# Patient Record
Sex: Male | Born: 1995 | Race: Black or African American | Hispanic: No | Marital: Single | Smoking: Never smoker
Health system: Southern US, Community
[De-identification: ages and names within clinical notes are randomized; demographics above are authoritative.]

---

## 2018-11-12 ENCOUNTER — Emergency Department (HOSPITAL_COMMUNITY)
Admission: EM | Admit: 2018-11-12 | Discharge: 2018-11-13 | Disposition: A | Discharge status: Home / Self Care | Payer: Managed Care, Other (non HMO) | Attending: Emergency Medicine | Admitting: Emergency Medicine

## 2018-11-12 DIAGNOSIS — E876 Hypokalemia: Secondary | ICD-10-CM | POA: Diagnosis not present

## 2018-11-12 DIAGNOSIS — R5381 Other malaise: Secondary | ICD-10-CM | POA: Diagnosis not present

## 2018-11-12 DIAGNOSIS — Z20822 Contact with and (suspected) exposure to covid-19: Secondary | ICD-10-CM

## 2018-11-12 DIAGNOSIS — R51 Headache: Secondary | ICD-10-CM | POA: Diagnosis present

## 2018-11-12 DIAGNOSIS — G44209 Tension-type headache, unspecified, not intractable: Secondary | ICD-10-CM

## 2018-11-12 DIAGNOSIS — R6889 Other general symptoms and signs: Secondary | ICD-10-CM

## 2018-11-13 ENCOUNTER — Other Ambulatory Visit: Payer: Self-pay

## 2018-11-13 ENCOUNTER — Emergency Department (HOSPITAL_COMMUNITY): Payer: Managed Care, Other (non HMO)

## 2018-11-13 LAB — CBC WITH DIFFERENTIAL/PLATELET
Abs Immature Granulocytes: 0.02 10*3/uL (ref 0.00–0.07)
Basophils Absolute: 0 10*3/uL (ref 0.0–0.1)
Basophils Relative: 0 %
Eosinophils Absolute: 0.1 10*3/uL (ref 0.0–0.5)
Eosinophils Relative: 1 %
HCT: 38.8 % — ABNORMAL LOW (ref 39.0–52.0)
Hemoglobin: 13.2 g/dL (ref 13.0–17.0)
Immature Granulocytes: 0 %
Lymphocytes Relative: 42 %
Lymphs Abs: 3.1 10*3/uL (ref 0.7–4.0)
MCH: 30.6 pg (ref 26.0–34.0)
MCHC: 34 g/dL (ref 30.0–36.0)
MCV: 90 fL (ref 80.0–100.0)
Monocytes Absolute: 0.6 10*3/uL (ref 0.1–1.0)
Monocytes Relative: 8 %
Neutro Abs: 3.6 10*3/uL (ref 1.7–7.7)
Neutrophils Relative %: 49 %
Platelets: 241 10*3/uL (ref 150–400)
RBC: 4.31 MIL/uL (ref 4.22–5.81)
RDW: 11.8 % (ref 11.5–15.5)
WBC: 7.4 10*3/uL (ref 4.0–10.5)
nRBC: 0 % (ref 0.0–0.2)

## 2018-11-13 LAB — COMPREHENSIVE METABOLIC PANEL
ALT: 16 U/L (ref 0–44)
AST: 20 U/L (ref 15–41)
Albumin: 4.4 g/dL (ref 3.5–5.0)
Alkaline Phosphatase: 50 U/L (ref 38–126)
Anion gap: 7 (ref 5–15)
BUN: 6 mg/dL (ref 6–20)
CO2: 26 mmol/L (ref 22–32)
Calcium: 9.2 mg/dL (ref 8.9–10.3)
Chloride: 104 mmol/L (ref 98–111)
Creatinine, Ser: 1.02 mg/dL (ref 0.61–1.24)
GFR calc Af Amer: >60 mL/min (ref >60)
GFR calc non Af Amer: >60 mL/min (ref >60)
Glucose, Bld: 176 mg/dL — ABNORMAL HIGH (ref 70–99)
Potassium: 3.1 mmol/L — ABNORMAL LOW (ref 3.5–5.1)
Sodium: 137 mmol/L (ref 135–145)
Total Bilirubin: 1 mg/dL (ref 0.3–1.2)
Total Protein: 6.7 g/dL (ref 6.5–8.1)

## 2018-11-13 LAB — URINALYSIS, ROUTINE W REFLEX MICROSCOPIC
Bilirubin Urine: NEGATIVE
Glucose, UA: NEGATIVE mg/dL
Hgb urine dipstick: NEGATIVE
Ketones, ur: NEGATIVE mg/dL
Leukocytes,Ua: NEGATIVE
Nitrite: NEGATIVE
Protein, ur: NEGATIVE mg/dL
Specific Gravity, Urine: 1.026 (ref 1.005–1.030)
pH: 6 (ref 5.0–8.0)

## 2018-11-13 LAB — INFLUENZA PANEL BY PCR (TYPE A & B)
Influenza A By PCR: NEGATIVE
Influenza B By PCR: NEGATIVE

## 2018-11-13 LAB — LACTIC ACID, PLASMA
Lactic Acid, Venous: 2.5 mmol/L (ref 0.5–1.9)
Lactic Acid, Venous: 1.1 mmol/L (ref 0.5–1.9)

## 2018-11-13 LAB — RAPID URINE DRUG SCREEN, HOSP PERFORMED
Amphetamines: NOT DETECTED
Barbiturates: NOT DETECTED
Benzodiazepines: NOT DETECTED
Cocaine: NOT DETECTED
Opiates: NOT DETECTED
Tetrahydrocannabinol: POSITIVE — AB

## 2018-11-13 MED ORDER — ACETAMINOPHEN 325 MG PO TABS
650.0000 mg | ORAL_TABLET | Freq: Once | ORAL | Status: AC
  Administered 2018-11-13: 650 mg via ORAL
  Filled 2018-11-13: qty 2

## 2018-11-13 MED ORDER — SODIUM CHLORIDE 0.9 % IV BOLUS
1000.0000 mL | Freq: Once | INTRAVENOUS | Status: AC
  Administered 2018-11-13: 01:00:00 1000 mL via INTRAVENOUS

## 2018-11-13 MED ORDER — POTASSIUM CHLORIDE CRYS ER 20 MEQ PO TBCR
40.0000 meq | EXTENDED_RELEASE_TABLET | Freq: Once | ORAL | Status: AC
  Administered 2018-11-13: 40 meq via ORAL
  Filled 2018-11-13: qty 2

## 2018-11-13 NOTE — ED Provider Notes (Signed)
[facility]   CSN: [id number] Arrival date & time: 11/12/18  2353    History   Chief Complaint Chief Complaint  Patient presents with  . Headache    HPI Anthony Murphy is a 23 y.o. male with a hx of no major medical problems presents to the Emergency Department complaining of gradual, persistent, progressively worsening posterior headache and generalized abd discomfort with associated fatigue onset this afternoon.  Pt reports describes the headache like a band across the back.  He denies vision changes, weakness, Pt reports he began "shaking" on his way over to the ED.  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  He denies known sick contacts, vision changes, neck pain, neck stiffness, chest pain, cough, SOB, N/V/D, syncope, dysuria.  Pt reports he smokes several black and mild cigars per day, drinks occasionally and smoked marijuana today.  Pt reports no EtOH today.  No aggravating or alleviating factors.  No treatments PTA.       The history is provided by the patient and medical records. No language interpreter was used.    No past medical history on file.  There are no active problems to display for this patient.    Home Medications    Prior to Admission medications   Not on File    Family History No family history on file.  Social History Social History   Tobacco Use  . Smoking status: Not on file  Substance Use Topics  . Alcohol use: Not on file  . Drug use: Not on file     Allergies   Patient has no known allergies.   Review of Systems Review of Systems  Constitutional: Positive for fatigue. Negative for appetite change, diaphoresis, fever and unexpected weight change.  HENT: Negative for mouth sores.   Eyes: Negative for visual disturbance.  Respiratory: Negative for cough, chest tightness, shortness of breath and wheezing.   Cardiovascular: Negative for chest pain.  Gastrointestinal:  Positive for abdominal pain. Negative for constipation, diarrhea, nausea and vomiting.  Endocrine: Negative for polydipsia, polyphagia and polyuria.  Genitourinary: Negative for dysuria, frequency, hematuria and urgency.  Musculoskeletal: Negative for back pain and neck stiffness.  Skin: Negative for rash.  Allergic/Immunologic: Negative for immunocompromised state.  Neurological: Positive for headaches. Negative for syncope and light-headedness.  Hematological: Does not bruise/bleed easily.  Psychiatric/Behavioral: Negative for sleep disturbance. The patient is not nervous/anxious.      Physical Exam Updated Vital Signs BP (!) 157/97   Pulse (!) 136   Temp 98.1 F (36.7 C) (Oral)   Resp 18   SpO2 100%   Physical Exam Vitals signs and nursing note reviewed.  Constitutional:      General: He is not in acute distress.    Appearance: He is not diaphoretic.     Comments: Rigors  HENT:     Head: Normocephalic.  Eyes:     General: No scleral icterus.    Conjunctiva/sclera: Conjunctivae normal.  Neck:     Musculoskeletal: Normal range of motion. Normal range of motion. No spinous process tenderness or muscular tenderness.  Cardiovascular:     Rate and Rhythm: Regular rhythm. Tachycardia present.     Pulses: Normal pulses.          Radial pulses are 2+ on the right side and 2+ on the left side.  Pulmonary:     Effort: No tachypnea, accessory muscle usage, prolonged expiration, respiratory distress or retractions.  Breath sounds: No stridor.     Comments: Equal chest rise. No increased work of breathing. Abdominal:     General: There is no distension.     Palpations: Abdomen is soft.     Tenderness: There is no abdominal tenderness. There is no guarding or rebound.  Musculoskeletal:     Comments: Moves all extremities equally and without difficulty.  Skin:    General: Skin is warm and dry.     Capillary Refill: Capillary refill takes less than 2 seconds.  Neurological:      Mental Status: He is alert.     GCS: GCS eye subscore is 4. GCS verbal subscore is 5. GCS motor subscore is 6.     Comments: Speech is clear and goal oriented.  Psychiatric:        Mood and Affect: Mood normal.      ED Treatments / Results  Labs (all labs ordered are listed, but only abnormal results are displayed) Labs Reviewed  LACTIC ACID, PLASMA - Abnormal; Notable for the following components:      Result Value   Lactic Acid, Venous 2.5 (*)    All other components within normal limits  COMPREHENSIVE METABOLIC PANEL - Abnormal; Notable for the following components:   Potassium 3.1 (*)    Glucose, Bld 176 (*)    All other components within normal limits  CBC WITH DIFFERENTIAL/PLATELET - Abnormal; Notable for the following components:   HCT 38.8 (*)    All other components within normal limits  RAPID URINE DRUG SCREEN, HOSP PERFORMED - Abnormal; Notable for the following components:   Tetrahydrocannabinol POSITIVE (*)    All other components within normal limits  CULTURE, BLOOD (ROUTINE X 2)  CULTURE, BLOOD (ROUTINE X 2)  LACTIC ACID, PLASMA  URINALYSIS, ROUTINE W REFLEX MICROSCOPIC  INFLUENZA PANEL BY PCR (TYPE A & B)    EKG EKG Interpretation  Date/Time:  Friday November 13 2018 00:20:05 EDT Ventricular Rate:  126 PR Interval:    QRS Duration: 88 QT Interval:  297 QTC Calculation: 430 R Axis:   87 Text Interpretation:  Sinus tachycardia Consider right atrial enlargement Borderline T wave abnormalities NO STEMI. No old tracing to compare Confirmed by Drema Pry [phone]) on 11/13/2018 2:03:33 AM   Radiology Dg Chest Port 1 View  Result Date: 11/13/2018 CLINICAL DATA:  Tachycardia, shaking and headache since last night. EXAM: PORTABLE CHEST 1 VIEW COMPARISON:  None. FINDINGS: Cardiomediastinal silhouette is normal. No pleural effusions or focal consolidations. Trachea projects midline and there is no pneumothorax. Soft tissue planes and included osseous structures  are non-suspicious. IMPRESSION: Negative. Electronically Signed   By: Awilda Metro M.D.   On: 11/13/2018 00:29    Procedures Procedures (including critical care time)  Medications Ordered in ED Medications  sodium chloride 0.9 % bolus 1,000 mL (0 mLs Intravenous Stopped 11/13/18 0128)  acetaminophen (TYLENOL) tablet 650 mg (650 mg Oral Given 11/13/18 0205)  potassium chloride SA (K-DUR,KLOR-CON) CR tablet 40 mEq (40 mEq Oral Given 11/13/18 0259)     Initial Impression / Assessment and Plan / ED Course  I have reviewed the triage vital signs and the nursing notes.  Pertinent labs & imaging results that were available during my care of the patient were reviewed by me and considered in my medical decision making (see chart for details).  Clinical Course as of Nov 13 518  Fri Nov 13, 2018  0010 Pt tachycardic  Pulse Rate(!): 136 [HM]    Clinical  Course User Index [HM] Dencil Cayson, Dahlia Client, PA-C        Gemari Kendal was evaluated in Emergency Department on 11/13/2018 for the symptoms described in the history of present illness. He was evaluated in the context of the global COVID-19 pandemic, which necessitated consideration that the patient might be at risk for infection with the SARS-CoV-2 virus that causes COVID-19. Institutional protocols and algorithms that pertain to the evaluation of patients at risk for COVID-19 are in a state of rapid change based on information released by regulatory bodies including the CDC and federal and state organizations. These policies and algorithms were followed during the patient's care in the ED.  Pt presents with headache and abd pain.  He is tachycardic with rigors.  Suspect fever. Concern for possible COVID.  Pt will have labs, fluid, CXR, ECG.    Initial labs reassuring however lactic acid is elevated at 2.5.  Mild hypokalemia with potassium 3.1, replaced here in the emergency department.  Drug screen positive for marijuana.  No evidence of urinary  tract infection.  Influenza panel negative.  Patient given fluids and Tylenol.  His heart rate has decreased to within normal limits and has remained there.  Patient is without hypotension.  No hypoxia.  Patient with complete resolution of his headache and abdominal discomfort.  He has tolerated p.o. here in the emergency department without return of abdominal discomfort, nausea or vomiting.  Repeat lactic acid is within normal limits.  Patient reports that he feels well and does wish to go home.  I have discussed the importance of symptom monitoring and fever control at home in addition to maintaining adequate hydration.  I have also discussed reasons to return immediately to the emergency department.  Patient states understanding and is in agreement with this plan.  The patient was discussed with Dr. Eudelia Bunch who agrees with the treatment plan.   BP (!) 109/57   Pulse 68   Temp 99.2 F (37.3 C) (Rectal)   Resp 14   SpO2 98%    Final Clinical Impressions(s) / ED Diagnoses   Final diagnoses:  Acute non intractable tension-type headache  Malaise  Suspected Covid-19 Virus Infection  Hypokalemia    ED Discharge Orders    None       Mardene Sayer Boyd Kerbs 11/13/18 0520    Nira Conn, MD 11/13/18 [phone]

## 2018-11-13 NOTE — ED Triage Notes (Signed)
Pt reports headache starting today. Pt reports he just doesn't feel well, feels shaky. Denies nausea. Pt denies sick contact.

## 2018-11-13 NOTE — Discharge Instructions (Signed)
1. Medications: usual home medications 2. Treatment: rest, drink plenty of fluids,  3. Follow Up: Please followup with your primary doctor in 2 days for discussion of your diagnoses and further evaluation after today's visit; if you do not have a primary care doctor use the resource guide provided to find one; Please return to the ER for worsening fevers, persistent vomiting, difficulty breathing or other concerns

## 2018-11-18 LAB — CULTURE, BLOOD (ROUTINE X 2)
Culture: NO GROWTH
Culture: NO GROWTH
Special Requests: ADEQUATE

## 2018-11-27 ENCOUNTER — Telehealth (HOSPITAL_COMMUNITY): Payer: Self-pay

## 2019-08-03 ENCOUNTER — Other Ambulatory Visit: Payer: Managed Care, Other (non HMO)

## 2019-09-30 ENCOUNTER — Other Ambulatory Visit: Payer: Self-pay

## 2019-09-30 ENCOUNTER — Encounter (HOSPITAL_COMMUNITY): Payer: Self-pay

## 2019-09-30 ENCOUNTER — Ambulatory Visit (HOSPITAL_COMMUNITY)
Admission: EM | Admit: 2019-09-30 | Discharge: 2019-09-30 | Disposition: A | Discharge status: Home / Self Care | Payer: Managed Care, Other (non HMO) | Attending: Family Medicine | Admitting: Family Medicine

## 2019-09-30 DIAGNOSIS — H9201 Otalgia, right ear: Secondary | ICD-10-CM

## 2019-09-30 MED ORDER — AMOXICILLIN 500 MG PO CAPS: 1000.0000 mg | ORAL_CAPSULE | Freq: Three times a day (TID) | ORAL | 0 refills | Status: AC

## 2019-09-30 MED ORDER — CARBAMIDE PEROXIDE 6.5 % OT SOLN: 5.0000 [drp] | Freq: Two times a day (BID) | OTIC | 0 refills | Status: AC

## 2019-09-30 NOTE — Discharge Instructions (Addendum)
Since some Debrox and antibiotics to the pharmacy to go ahead and cover for ear infection in the right ear. He can take ibuprofen for pain as needed Follow up as needed for continued or worsening symptoms

## 2019-09-30 NOTE — ED Provider Notes (Signed)
[facility]    CSN: [id number] Arrival date & time: 09/30/19  1343      History   Chief Complaint Chief Complaint  Patient presents with  . Otalgia    HPI Anthony Murphy is a 24 y.o. male.   Pt is a 24 year old male that presents with right ear discomfort. This has been since yesterday. Took tylenol and used peroxide to clean with no relief. This pain has been coming and going. No fever, nasal congestion, rhinorrhea, dizziness. Some muffled hearing. No injury or foreign body.   ROS per HPI      History reviewed. No pertinent past medical history.  There are no problems to display for this patient.   History reviewed. No pertinent surgical history.     Home Medications    Prior to Admission medications   Medication Sig Start Date End Date Taking? Authorizing Provider  amoxicillin (AMOXIL) 500 MG capsule Take 2 capsules (1,000 mg total) by mouth 3 (three) times daily for 5 days. 09/30/19 10/05/19  Dahlia Byes A, NP  carbamide peroxide (DEBROX) 6.5 % OTIC solution Place 5 drops into the right ear 2 (two) times daily. 09/30/19   Janace Aris, NP    Family History Family History  Problem Relation Age of Onset  . Healthy Mother   . Healthy Father     Social History Social History   Tobacco Use  . Smoking status: Never Smoker  . Smokeless tobacco: Never Used  Substance Use Topics  . Alcohol use: Not Currently  . Drug use: Not on file     Allergies   Patient has no known allergies.   Review of Systems Review of Systems   Physical Exam Triage Vital Signs ED Triage Vitals  Enc Vitals Group     BP 09/30/19 1412 122/73     Pulse Rate 09/30/19 1412 78     Resp 09/30/19 1412 16     Temp 09/30/19 1412 98.5 F (36.9 C)     Temp Source 09/30/19 1412 Oral     SpO2 09/30/19 1412 99 %     Weight --      Height --      Head Circumference --      Peak Flow --      Pain Score 09/30/19 1411 0     Pain Loc --      Pain Edu? --      Excl. in GC?  --    No data found.  Updated Vital Signs BP 122/73 (BP Location: Left Arm)   Pulse 78   Temp 98.5 F (36.9 C) (Oral)   Resp 16   SpO2 99%   Visual Acuity Right Eye Distance:   Left Eye Distance:   Bilateral Distance:    Right Eye Near:   Left Eye Near:    Bilateral Near:     Physical Exam Vitals and nursing note reviewed.  Constitutional:      Appearance: Normal appearance.  HENT:     Head: Normocephalic and atraumatic.     Right Ear: External ear normal. There is impacted cerumen.     Left Ear: Tympanic membrane, ear canal and external ear normal.     Nose: Nose normal.     Mouth/Throat:     Pharynx: Oropharynx is clear. No posterior oropharyngeal erythema.  Eyes:     Conjunctiva/sclera: Conjunctivae normal.  Pulmonary:     Effort: Pulmonary effort is normal.  Musculoskeletal:  General: Normal range of motion.     Cervical back: Normal range of motion.  Skin:    General: Skin is warm and dry.  Neurological:     Mental Status: He is alert.  Psychiatric:        Mood and Affect: Mood normal.      UC Treatments / Results  Labs (all labs ordered are listed, but only abnormal results are displayed) Labs Reviewed - No data to display  EKG   Radiology No results found.  Procedures Procedures (including critical care time)  Medications Ordered in UC Medications - No data to display  Initial Impression / Assessment and Plan / UC Course  I have reviewed the triage vital signs and the nursing notes.  Pertinent labs & imaging results that were available during my care of the patient were reviewed by me and considered in my medical decision making (see chart for details).     Cerumen impaction with right otitis media Ear irrigated here in clinic.  Pt tolerated well  Erythematous post irrigation.  Pt still having pain.  Will go ahead and cover for ear infection Ibuprofen for pain  Recommended debrox in the future.  Follow up as needed for  continued or worsening symptoms  Final Clinical Impressions(s) / UC Diagnoses   Final diagnoses:  Otalgia of right ear     Discharge Instructions     Since some Debrox and antibiotics to the pharmacy to go ahead and cover for ear infection in the right ear. He can take ibuprofen for pain as needed Follow up as needed for continued or worsening symptoms     ED Prescriptions    Medication Sig Dispense Auth. Provider   amoxicillin (AMOXIL) 500 MG capsule Take 2 capsules (1,000 mg total) by mouth 3 (three) times daily for 5 days. 30 capsule Hosam Mcfetridge A, NP   carbamide peroxide (DEBROX) 6.5 % OTIC solution Place 5 drops into the right ear 2 (two) times daily. 15 mL Laureen Frederic A, NP     PDMP not reviewed this encounter.   Loura Halt A, NP 10/02/19 1014

## 2019-09-30 NOTE — ED Triage Notes (Signed)
Patient presents to Urgent Care with complaints of right ear pain since yesterday. Patient reports he has been taking tylenol but it is not helping.

## 2021-02-23 IMAGING — DX PORTABLE CHEST - 1 VIEW
1 series · 1 of 1 positions shown · non-contrast
Comparison: None.

CLINICAL DATA: Tachycardia, shaking and headache since last night.

EXAM:
PORTABLE CHEST 1 VIEW

[chest]
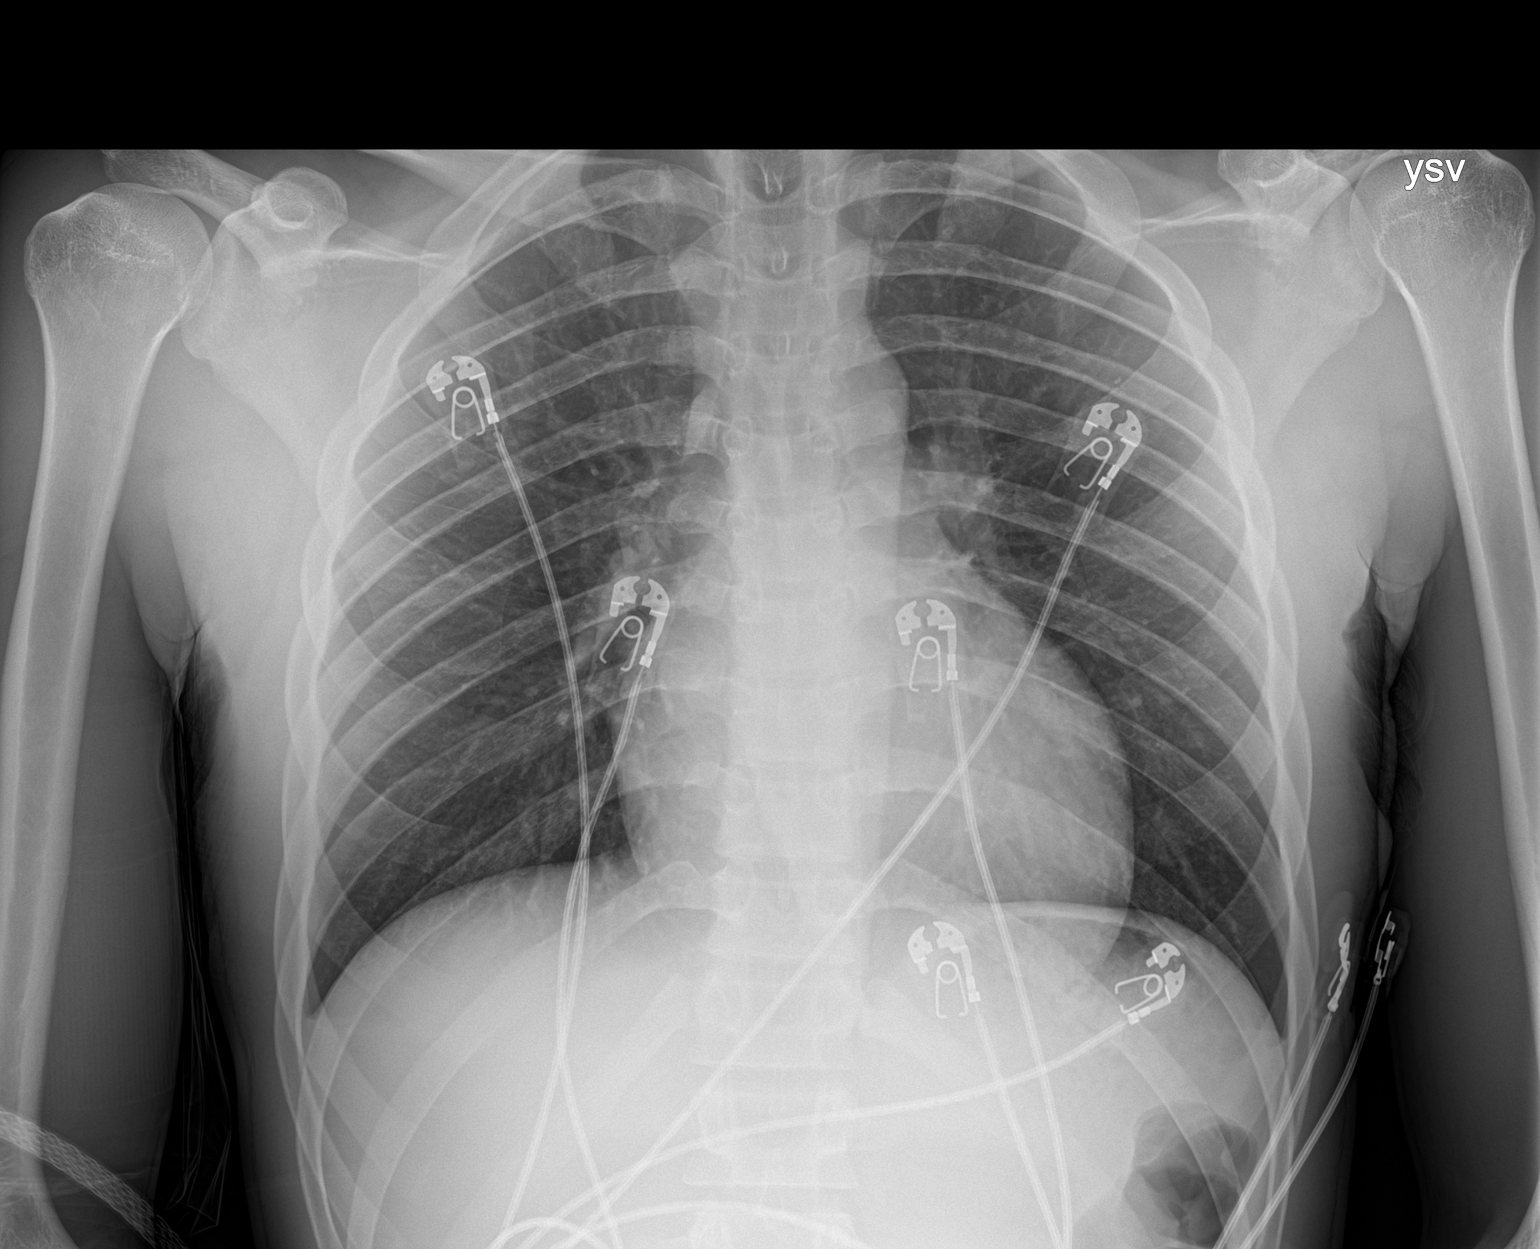

[1 of 1 positions shown; findings below may reference images not displayed]

FINDINGS: Cardiomediastinal silhouette is normal. No pleural effusions or
focal consolidations. Trachea projects midline and there is no
pneumothorax. Soft tissue planes and included osseous structures are
non-suspicious.
IMPRESSION: Negative.
